# Patient Record
Sex: Male | Born: 2000 | Race: Black or African American | Marital: Single | Smoking: Never smoker
Health system: Southern US, Community
[De-identification: ages and names within clinical notes are randomized; demographics above are authoritative.]

---

## 2019-12-08 ENCOUNTER — Ambulatory Visit: Payer: Self-pay | Attending: Family

## 2019-12-08 DIAGNOSIS — Z23 Encounter for immunization: Secondary | ICD-10-CM

## 2019-12-08 NOTE — Progress Notes (Signed)
   Covid-19 Vaccination Clinic  Name:  Melvin Mcdonald    MRN: [id number] DOB: November 06, 2000  12/08/2019  [name] was observed post Covid-19 immunization for 15 minutes without incident. He was provided with Vaccine Information Sheet and instruction to access the V-Safe system.   [name] was instructed to call 911 with any severe reactions post vaccine: Marland Kitchen Difficulty breathing  . Swelling of face and throat  . A fast heartbeat  . A bad rash all over body  . Dizziness and weakness   Immunizations Administered    Name Date Dose VIS Date Route   Moderna COVID-19 Vaccine 12/08/2019 11:02 AM 0.5 mL 08/02/2019 Intramuscular   Manufacturer: Moderna   Lot: [id number]   NDC: 55208-022-33

## 2020-01-10 ENCOUNTER — Ambulatory Visit: Payer: Self-pay | Attending: Family

## 2020-01-10 DIAGNOSIS — Z23 Encounter for immunization: Secondary | ICD-10-CM

## 2020-01-10 NOTE — Progress Notes (Signed)
   Covid-19 Vaccination Clinic  Name:  Melvin Mcdonald    MRN: [id number] DOB: 14-Mar-2001  01/10/2020  [name] was observed post Covid-19 immunization for 15 minutes without incident. He was provided with Vaccine Information Sheet and instruction to access the V-Safe system.   [name] was instructed to call 911 with any severe reactions post vaccine: Marland Kitchen Difficulty breathing  . Swelling of face and throat  . A fast heartbeat  . A bad rash all over body  . Dizziness and weakness   Immunizations Administered    Name Date Dose VIS Date Route   Moderna COVID-19 Vaccine 01/10/2020 10:23 AM 0.5 mL 08/2019 Intramuscular   Manufacturer: Moderna   Lot: [id number]   NDC: 70623-762-83

## 2020-06-03 ENCOUNTER — Other Ambulatory Visit: Payer: Self-pay

## 2020-06-03 ENCOUNTER — Encounter: Payer: Self-pay | Admitting: Family Medicine

## 2020-06-03 ENCOUNTER — Ambulatory Visit
Admission: EM | Admit: 2020-06-03 | Discharge: 2020-06-03 | Disposition: A | Discharge status: Home / Self Care | Payer: BC Managed Care – PPO | Attending: Family Medicine | Admitting: Family Medicine

## 2020-06-03 ENCOUNTER — Ambulatory Visit (INDEPENDENT_AMBULATORY_CARE_PROVIDER_SITE_OTHER): Payer: BC Managed Care – PPO

## 2020-06-03 DIAGNOSIS — M25572 Pain in left ankle and joints of left foot: Secondary | ICD-10-CM

## 2020-06-03 DIAGNOSIS — S92155A Nondisplaced avulsion fracture (chip fracture) of left talus, initial encounter for closed fracture: Secondary | ICD-10-CM

## 2020-06-03 NOTE — ED Triage Notes (Signed)
Pt states he fell on Monday and injured his left ankle. Pt states he has been unable to walk on it until yesterday. Pain has generally improved but he still has pain when turning different directions. Pt is aox4 and ambulatory.

## 2020-06-03 NOTE — ED Provider Notes (Signed)
[facility]    CSN: [id number] Arrival date & time: 06/03/20  1528      History   Chief Complaint Chief Complaint  Patient presents with  . Ankle Pain    since fall on Monday    HPI Melvin Mcdonald is a 19 y.o. male.   Initial [facility] patient visit.  19 yo male presents with left ankle pain following twisting type trauma.Pt states he fell on Monday and injured his left ankle. Pt states he has been unable to walk on it until yesterday. Pain has generally improved but he still has pain when turning different directions. Pt is aox4 and ambulatory     History reviewed. No pertinent past medical history.  There are no problems to display for this patient.   History reviewed. No pertinent surgical history.     Home Medications    Prior to Admission medications   Not on File    Family History History reviewed. No pertinent family history.  Social History Social History   Tobacco Use  . Smoking status: Never Smoker  . Smokeless tobacco: Never Used  Vaping Use  . Vaping Use: Never used  Substance Use Topics  . Alcohol use: Not Currently  . Drug use: Never     Allergies   Patient has no known allergies.   Review of Systems Review of Systems  Musculoskeletal: Positive for gait problem.     Physical Exam Triage Vital Signs ED Triage Vitals  Enc Vitals Group     BP      Pulse      Resp      Temp      Temp src      SpO2      Weight      Height      Head Circumference      Peak Flow      Pain Score      Pain Loc      Pain Edu?      Excl. in GC?    No data found.  Updated Vital Signs BP 126/67 (BP Location: Left Arm)   Pulse 74   Temp 98 F (36.7 C) (Oral)   Resp 12   SpO2 97%    Physical Exam Vitals and nursing note reviewed.  Constitutional:      Appearance: Normal appearance.  Eyes:     Conjunctiva/sclera: Conjunctivae normal.  Pulmonary:     Breath sounds: Normal breath sounds.  Musculoskeletal:        General:  Swelling, tenderness and signs of injury present. No deformity. Normal range of motion.     Cervical back: Normal range of motion and neck supple.     Comments: Tender over left lateral talus  Skin:    General: Skin is warm and dry.     Findings: No bruising.  Neurological:     General: No focal deficit present.     Mental Status: He is alert.  Psychiatric:        Mood and Affect: Mood normal.      UC Treatments / Results  Labs (all labs ordered are listed, but only abnormal results are displayed) Labs Reviewed - No data to display  EKG   Radiology No results found.  Procedures Procedures (including critical care time)  Medications Ordered in UC Medications - No data to display  Initial Impression / Assessment and Plan / UC Course  I have reviewed the triage vital signs and the nursing notes.  Pertinent labs & imaging results that were available during my care of the patient were reviewed by me and considered in my medical decision making (see chart for details).    Final Clinical Impressions(s) / UC Diagnoses   Final diagnoses:  Closed nondisplaced avulsion fracture of left talus, initial encounter     Discharge Instructions     You have a small fracture of the talus bone.  It should heal in 4 weeks.  We recommend you follow up with the orthopedist     ED Prescriptions    None     I have reviewed the PDMP during this encounter.   Elvina Sidle, MD 06/03/20 [phone]

## 2020-06-03 NOTE — Discharge Instructions (Addendum)
You have a small fracture of the talus bone.  It should heal in 4 weeks.  We recommend you follow up with the orthopedist

## 2020-06-15 ENCOUNTER — Other Ambulatory Visit: Payer: Self-pay

## 2020-06-15 ENCOUNTER — Ambulatory Visit
Admission: RE | Admit: 2020-06-15 | Discharge: 2020-06-15 | Disposition: A | Discharge status: Home / Self Care | Payer: BC Managed Care – PPO | Source: Ambulatory Visit

## 2020-06-15 VITALS — BP 125/74 | HR 74 | Temp 98.5°F | Resp 18

## 2020-06-15 DIAGNOSIS — M67432 Ganglion, left wrist: Secondary | ICD-10-CM

## 2020-06-15 NOTE — ED Triage Notes (Signed)
Pt c/o knot to lt wrist when bending after working out with 2lb hand weights. States pain when twisting wrist.

## 2020-06-15 NOTE — Discharge Instructions (Signed)
Ibuprofen 600-800mg  three times a day for 5-10 days. Can add ice compress if having swelling. Follow up with hand orthopedics if symptoms worsens, does not resolve or reoccurs frequently.

## 2020-06-15 NOTE — ED Provider Notes (Signed)
[facility]    CSN: [id number] Arrival date & time: 06/15/20  1649      History   Chief Complaint Chief Complaint  Patient presents with  . appt 5-wrist pain    HPI Melvin Mcdonald is a 19 y.o. male.   19 year old male comes in for left wrist pain, swelling for the past few days. States noticed after doing 2lb wrist exercise. Pain is worse with ROM without radiation of pain. Denies numbness/tingling, loss of grip strength. LHD     History reviewed. No pertinent past medical history.  There are no problems to display for this patient.   History reviewed. No pertinent surgical history.     Home Medications    Prior to Admission medications   Not on File    Family History No family history on file.  Social History Social History   Tobacco Use  . Smoking status: Never Smoker  . Smokeless tobacco: Never Used  Vaping Use  . Vaping Use: Never used  Substance Use Topics  . Alcohol use: Not Currently  . Drug use: Never     Allergies   Patient has no known allergies.   Review of Systems Review of Systems  Reason unable to perform ROS: See HPI as above.     Physical Exam Triage Vital Signs ED Triage Vitals  Enc Vitals Group     BP 06/15/20 1746 125/74     Pulse Rate 06/15/20 1746 74     Resp 06/15/20 1746 18     Temp 06/15/20 1746 98.5 F (36.9 C)     Temp Source 06/15/20 1746 Oral     SpO2 06/15/20 1746 98 %     Weight --      Height --      Head Circumference --      Peak Flow --      Pain Score 06/15/20 1747 3     Pain Loc --      Pain Edu? --      Excl. in GC? --    No data found.  Updated Vital Signs BP 125/74 (BP Location: Left Arm)   Pulse 74   Temp 98.5 F (36.9 C) (Oral)   Resp 18   SpO2 98%   Physical Exam Constitutional:      General: He is not in acute distress.    Appearance: Normal appearance. He is well-developed. He is not toxic-appearing or diaphoretic.  HENT:     Head: Normocephalic and atraumatic.   Eyes:     Conjunctiva/sclera: Conjunctivae normal.     Pupils: Pupils are equal, round, and reactive to light.  Pulmonary:     Effort: Pulmonary effort is normal. No respiratory distress.  Musculoskeletal:     Cervical back: Normal range of motion and neck supple.     Comments: 0.5cm round mobile lesion to the dorsum of left wrist. No erythema, warmth. No tenderness to palpation. Full ROM of wrist. Strength 5/5. Sensation intact. Radial pulse 2+  Skin:    General: Skin is warm and dry.  Neurological:     Mental Status: He is alert and oriented to person, place, and time.      UC Treatments / Results  Labs (all labs ordered are listed, but only abnormal results are displayed) Labs Reviewed - No data to display  EKG   Radiology No results found.  Procedures Procedures (including critical care time)  Medications Ordered in UC Medications - No data to display  Initial Impression / Assessment and Plan / UC Course  I have reviewed the triage vital signs and the nursing notes.  Pertinent labs & imaging results that were available during my care of the patient were reviewed by me and considered in my medical decision making (see chart for details).    History and exam consistent with ganglion cyst. NSAIDs, ice compress, rest. Return precautions given.  Final Clinical Impressions(s) / UC Diagnoses   Final diagnoses:  Ganglion cyst of dorsum of left wrist    ED Prescriptions    None     PDMP not reviewed this encounter.   Belinda Fisher, PA-C 06/15/20 [phone]

## 2020-07-11 ENCOUNTER — Ambulatory Visit: Payer: BC Managed Care – PPO | Admitting: Medical

## 2020-07-11 ENCOUNTER — Encounter: Payer: Self-pay | Admitting: Medical

## 2020-07-11 ENCOUNTER — Other Ambulatory Visit: Payer: Self-pay

## 2020-07-11 VITALS — BP 124/84 | HR 78 | Ht 66.0 in | Wt 198.4 lb

## 2020-07-11 DIAGNOSIS — G479 Sleep disorder, unspecified: Secondary | ICD-10-CM | POA: Diagnosis not present

## 2020-07-11 DIAGNOSIS — N528 Other male erectile dysfunction: Secondary | ICD-10-CM | POA: Diagnosis not present

## 2020-07-11 NOTE — Patient Instructions (Signed)
RESOURCES in [place], [place]  If you are experiencing a mental health crisis or an emergency, please call 911 or go to the nearest emergency department.  [facility]   [phone] [facility]  [phone] [facility]   [phone]  Suicide Hotline 1-800-Suicide 910 587 4423)  National Suicide Prevention Lifeline 531 079 2274  920 570 7594)  Domestic Violence, Rape/Crisis - [facility] [place] 742-595-6387  [employer] Violence Hotline 1-800-799-SAFE (380)572-6356)  To report Child or Elder Abuse, please call: [facility] Police Department  [phone] [facility] Department  [phone]  Teen Crisis line 360-047-8653 or 434-358-0554     Counseling Services (NON- psychiatrist offices)  Dr. Nicole Cella, [place] [phone] [address] [place], [place] [address]   [facility] [address] [place], [place] [address] [phone]   Glade Lloyd, therapist [phone] [address] [place], [place], [place] [address]   [facility] [phone] [address] [place], [place], [place] [address]     Erectile Dysfunction Erectile dysfunction (ED) is the inability to get or keep an erection in order to have sexual intercourse. Erectile dysfunction may include:  Inability to get an erection.  Lack of enough hardness of the erection to allow penetration.  Loss of the erection before sex is finished. What are the causes? This condition may be caused by:  Certain medicines, such as: ? Pain relievers. ? Antihistamines. ? Antidepressants. ? Blood pressure medicines. ? Water pills (diuretics). ? Ulcer medicines. ? Muscle relaxants. ? Drugs.  Excessive drinking.  Psychological causes, such as: ? Anxiety. ? Depression. ? Sadness. ? Exhaustion. ? Performance fear. ? Stress.  Physical causes, such as: ? Artery problems. This may include diabetes, smoking,  liver disease, or atherosclerosis. ? High blood pressure. ? Hormonal problems, such as low testosterone. ? Obesity. ? Nerve problems. This may include back or pelvic injuries, diabetes mellitus, multiple sclerosis, or Parkinson disease. What are the signs or symptoms? Symptoms of this condition include:  Inability to get an erection.  Lack of enough hardness of the erection to allow penetration.  Loss of the erection before sex is finished.  Normal erections at some times, but with frequent unsatisfactory episodes.  Low sexual satisfaction in either partner due to erection problems.  A curved penis occurring with erection. The curve may cause pain or the penis may be too curved to allow for intercourse.  Never having nighttime erections. How is this diagnosed? This condition is often diagnosed by:  Performing a physical exam to find other diseases or specific problems with the penis.  Asking you detailed questions about the problem.  Performing blood tests to check for diabetes mellitus or to measure hormone levels.  Performing other tests to check for underlying health conditions.  Performing an ultrasound exam to check for scarring.  Performing a test to check blood flow to the penis.  Doing a sleep study at home to measure nighttime erections. How is this treated? This condition may be treated by:  Medicine taken by mouth to help you achieve an erection (oral medicine).  Hormone replacement therapy to replace low testosterone levels.  Medicine that is injected into the penis. Your health care provider may instruct you how to give yourself these injections at home.  Vacuum pump. This is a pump with a ring on it. The pump and ring are placed on the penis and used to create pressure that helps the penis become erect.  Penile implant surgery. In this procedure, you may receive: ?  An inflatable implant. This consists of cylinders, a pump, and a reservoir. The cylinders  can be inflated with a fluid that helps to create an erection, and they can be deflated after intercourse. ? A semi-rigid implant. This consists of two silicone rubber rods. The rods provide some rigidity. They are also flexible, so the penis can both curve downward in its normal position and become straight for sexual intercourse.  Blood vessel surgery, to improve blood flow to the penis. During this procedure, a blood vessel from a different part of the body is placed into the penis to allow blood to flow around (bypass) damaged or blocked blood vessels.  Lifestyle changes, such as exercising more, losing weight, and quitting smoking. Follow these instructions at home: Medicines   Take over-the-counter and prescription medicines only as told by your health care provider. Do not increase the dosage without first discussing it with your health care provider.  If you are using self-injections, perform injections as directed by your health care provider. Make sure to avoid any veins that are on the surface of the penis. After giving an injection, apply pressure to the injection site for 5 minutes. General instructions  Exercise regularly, as directed by your health care provider. Work with your health care provider to lose weight, if needed.  Do not use any products that contain nicotine or tobacco, such as cigarettes and e-cigarettes. If you need help quitting, ask your health care provider.  Before using a vacuum pump, read the instructions that come with the pump and discuss any questions with your health care provider.  Keep all follow-up visits as told by your health care provider. This is important. Contact a health care provider if:  You feel nauseous.  You vomit. Get help right away if:  You are taking oral or injectable medicines and you have an erection that lasts longer than 4 hours. If your health care provider is unavailable, go to the nearest emergency room for evaluation. An  erection that lasts much longer than 4 hours can result in permanent damage to your penis.  You have severe pain in your groin or abdomen.  You develop redness or severe swelling of your penis.  You have redness spreading up into your groin or lower abdomen.  You are unable to urinate.  You experience chest pain or a rapid heart beat (palpitations) after taking oral medicines. Summary  Erectile dysfunction (ED) is the inability to get or keep an erection during sexual intercourse. This problem can usually be treated successfully.  This condition is diagnosed based on a physical exam, your symptoms, and tests to determine the cause. Treatment varies depending on the cause, and may include medicines, hormone therapy, surgery, or vacuum pump.  You may need follow-up visits to make sure that you are using your medicines or devices correctly.  Get help right away if you are taking or injecting medicines and you have an erection that lasts longer than 4 hours. This information is not intended to replace advice given to you by your health care provider. Make sure you discuss any questions you have with your health care provider. Document Revised: 07/31/2017 Document Reviewed: 09/03/2016 Elsevier Patient Education  2020 [employer].

## 2020-07-11 NOTE — Progress Notes (Signed)
Subjective:  Melvin Mcdonald is a 19 y.o. male who presents for Chief Complaint  Patient presents with  . New Patient (Initial Visit)    establish care   . Sexual Problem    problems getting/stayig aroused      Here as a new patient today.  He reports problems getting and keeping erections.  Is usually erect enough for sex but not usual full erection.  This happens often but not all the time.  He does get morning erections.  Maybe not as frequently as an in the past, but he does get them regularly.  Sometimes he gets a full erection with masturbation sometimes not.  He sometimes masturbate more than once a day.  There have been a few times where he had some pain with masturbation or ejaculation but he attributes that to multiple masturbation episodes in a short period of time.  He denies any blood in the urine or semen.  No testicular pain or mass.  He does note a gradual upward curve of the penis but no significant curvature or pain with erections.  He notes 2 prior relationships.  His current relationship x2 months with a male.  He in the same person dated last year but she broke up not wanting a committed relationship for period of time.  His prior relationship ended after that person wanted to be sexually active with other partners.  He notes prior STD testing, no prior STD  He denies any major significant conversations with his parents or others about sex growing up  He has tried a tea with horny goat weed.    Denies mood problems, no depression,denies anxiety.  He works part-time at [employer] in [place].  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  He exercises some.  Does not always sleep the best, gets 5 to 6 hours of sleep most nights.  No other aggravating or relieving factors.    No other c/o.  The following portions of the patient's history were reviewed and updated as appropriate: allergies, current medications, past family  history, past medical history, past social history, past surgical history and problem list.  ROS Otherwise as in subjective above  Objective: BP 124/84   Pulse 78   Ht 5\' 6"  (1.676 m)   Wt 198 lb 6.4 oz (90 kg)   SpO2 96%   BMI 32.02 kg/m   General appearance: alert, no distress, well developed, well nourished Neck: supple, no lymphadenopathy, no thyromegaly, no masses Heart: RRR, normal S1, S2, no murmurs Lungs: CTA bilaterally, no wheezes, rhonchi, or rales Pulses: 2+ radial pulses, 2+ pedal pulses, normal cap refill Ext: no edema GU: normal male, circ, no testicular mass or lesions, no hernia, no lymphadenopathy, no obvious scar tissue or deformity    Assessment: Encounter Diagnoses  Name Primary?  . Other male erectile dysfunction Yes  . Sleep disturbance      Plan: We discussed his symptoms and concerns.  I do not suspect a physical problem or cause of his symptoms.  I think this is more psychosocial.  We discussed how many things can play a role in whether he gets a full erection or not.  I strongly recommended counseling.  Gave him a list of counselors to consider given this chapter in stage in his life and given the new relationship.  We discussed limiting masturbation to once daily, and discussed the role that overstimulation or porn or other stimuli can play in  altering arousal particularly with relationships and our own emotions.  Reiterated to him that he does not have a physical deformity or obvious medical problem, reassured.  Encourage counseling, discussed some literature to read to help.  Advise if any pain with intercourse, if any significant curvature of the penis, or if not seeing some improvements over the next several weeks we can consider other steps.  Jakeem was seen today for new patient (initial visit) and sexual problem.  Diagnoses and all orders for this visit:  Other male erectile dysfunction  Sleep disturbance    Follow up: soon for  physical

## 2021-03-12 ENCOUNTER — Other Ambulatory Visit: Payer: Self-pay

## 2021-03-12 ENCOUNTER — Ambulatory Visit (INDEPENDENT_AMBULATORY_CARE_PROVIDER_SITE_OTHER): Payer: BC Managed Care – PPO

## 2021-03-12 ENCOUNTER — Ambulatory Visit
Admission: EM | Admit: 2021-03-12 | Discharge: 2021-03-12 | Disposition: A | Discharge status: Home / Self Care | Payer: BC Managed Care – PPO | Attending: Student | Admitting: Student

## 2021-03-12 DIAGNOSIS — S92354A Nondisplaced fracture of fifth metatarsal bone, right foot, initial encounter for closed fracture: Secondary | ICD-10-CM

## 2021-03-12 DIAGNOSIS — M79671 Pain in right foot: Secondary | ICD-10-CM | POA: Diagnosis not present

## 2021-03-12 NOTE — ED Triage Notes (Signed)
While running yesterday, Pt make a quick turn a "rolled" his right foot causing an onset of swelling and pain in the dorsum surface of his right foot. Notes difficulty bending 4th and fifth toes. Has been taking ibuprofen with some relief. Denies ankle pain. No LLE sxs.

## 2021-03-12 NOTE — ED Provider Notes (Signed)
[facility]    CSN: [id number] Arrival date & time: 03/12/21  2595      History   Chief Complaint Chief Complaint  Patient presents with   Foot Pain    right    HPI Melvin Mcdonald is a 20 y.o. male presenting with R foot pain following eversion injury that occurred 1 day ago while running. He does have a history of R ankle sprain about 1 year ago, same ankle.  Denies any ankle pain today, but does endorse pain of the lateral right foot, worse with ambulating.  Denies sensation changes.  Denies falls, pain or injury elsewhere.    HPI  History reviewed. No pertinent past medical history.  Patient Active Problem List   Diagnosis Date Noted   Other male erectile dysfunction 07/11/2020   Sleep disturbance 07/11/2020    History reviewed. No pertinent surgical history.     Home Medications    Prior to Admission medications   Not on File    Family History History reviewed. No pertinent family history.  Social History Social History   Tobacco Use   Smoking status: Never   Smokeless tobacco: Never  Vaping Use   Vaping Use: Never used  Substance Use Topics   Alcohol use: Not Currently   Drug use: Never     Allergies   Patient has no known allergies.   Review of Systems Review of Systems  Musculoskeletal:         R foot pain  All other systems reviewed and are negative.   Physical Exam Triage Vital Signs ED Triage Vitals  Enc Vitals Group     BP 03/12/21 1023 112/70     Pulse Rate 03/12/21 1023 67     Resp 03/12/21 1023 18     Temp 03/12/21 1023 97.9 F (36.6 C)     Temp Source 03/12/21 1023 Oral     SpO2 03/12/21 1023 97 %     Weight --      Height --      Head Circumference --      Peak Flow --      Pain Score 03/12/21 1029 8     Pain Loc --      Pain Edu? --      Excl. in GC? --    No data found.  Updated Vital Signs BP 112/70 (BP Location: Left Arm)   Pulse 67   Temp 97.9 F (36.6 C) (Oral)   Resp 18   SpO2 97%    Visual Acuity Right Eye Distance:   Left Eye Distance:   Bilateral Distance:    Right Eye Near:   Left Eye Near:    Bilateral Near:     Physical Exam Vitals reviewed.  Constitutional:      General: He is not in acute distress.    Appearance: Normal appearance. He is not ill-appearing or diaphoretic.  HENT:     Head: Normocephalic and atraumatic.  Cardiovascular:     Rate and Rhythm: Normal rate and regular rhythm.     Heart sounds: Normal heart sounds.  Pulmonary:     Effort: Pulmonary effort is normal.     Breath sounds: Normal breath sounds.  Musculoskeletal:     Comments: R lateral foot with tenderness and effusion over lateral 5th metatarsal. No ecchymosis or abrasion. Sensation intact. ROM 5th toe limited. Cap refill <2 seconds, DP 2+. No  medial or lateral malleolar tenderness, no tenderness along ATF ligament. Plantar and  dorsiflexion intact and without pain.  Skin:    General: Skin is warm.  Neurological:     General: No focal deficit present.     Mental Status: He is alert and oriented to person, place, and time.  Psychiatric:        Mood and Affect: Mood normal.        Behavior: Behavior normal.        Thought Content: Thought content normal.        Judgment: Judgment normal.     UC Treatments / Results  Labs (all labs ordered are listed, but only abnormal results are displayed) Labs Reviewed - No data to display  EKG   Radiology DG Foot Complete Right  Result Date: 03/12/2021 CLINICAL DATA:  Rolled foot playing laser tag 1 day ago, RIGHT foot pain and swelling, increased pain with weight-bearing EXAM: RIGHT FOOT COMPLETE - 3+ VIEW COMPARISON:  None FINDINGS: Osseous mineralization normal. Joint spaces preserved. Nondisplaced fracture at base of fifth metatarsal. No additional fracture, dislocation, or bone destruction. IMPRESSION: Nondisplaced fracture at base of RIGHT fifth metatarsal. Electronically Signed   By: Ulyses Southward M.D.   On: 03/12/2021 11:09     Procedures Procedures (including critical care time)  Medications Ordered in UC Medications - No data to display  Initial Impression / Assessment and Plan / UC Course  I have reviewed the triage vital signs and the nursing notes.  Pertinent labs & imaging results that were available during my care of the patient were reviewed by me and considered in my medical decision making (see chart for details).     This patient is a very pleasant 20 y.o. year old male presenting with R 5th metatarsal fracture. Neurovascularly intact.   Xray R foot- Nondisplaced fracture at base of RIGHT fifth metatarsal.  Given small nondisplaced fracture with some swelling, will place in CAM boot with crutches and nonweightbearing, f/u with ortho at their earliest convenience.  He already has a cam boot at home from spraining his ankle 1 year ago, so he declines  new cam boot today.  RICE, Tylenol/ibuprofen.  ED return precautions discussed. Patient verbalizes understanding and agreement.    Final Clinical Impressions(s) / UC Diagnoses   Final diagnoses:  Closed nondisplaced fracture of fifth metatarsal bone of right foot, initial encounter     Discharge Instructions      -You have a fracture at the side of your right foot.  -Xray R foot- Nondisplaced fracture at base of RIGHT fifth metatarsal. -Call [facility] today and schedule a follow-up appointment with them, information below. -When you walk, use your walking boot and crutches to avoid putting much weight on your foot.  You can take the boot off to shower, but make sure to not put weight on your foot without the boot. -For pain, ice, elevation, Take Tylenol 1000 mg 3 times daily, and ibuprofen 800 mg 3 times daily with food.  You can take these together, or alternate every 3-4 hours.      ED Prescriptions   None    PDMP not reviewed this encounter.   Rhys Martini, PA-C 03/12/21 1256

## 2021-03-12 NOTE — Discharge Instructions (Addendum)
-  You have a fracture at the side of your right foot.  -Xray R foot- Nondisplaced fracture at base of RIGHT fifth metatarsal. -Call [facility] today and schedule a follow-up appointment with them, information below. -When you walk, use your walking boot and crutches to avoid putting much weight on your foot.  You can take the boot off to shower, but make sure to not put weight on your foot without the boot. -For pain, ice, elevation, Take Tylenol 1000 mg 3 times daily, and ibuprofen 800 mg 3 times daily with food.  You can take these together, or alternate every 3-4 hours.

## 2021-09-12 ENCOUNTER — Ambulatory Visit
Admission: EM | Admit: 2021-09-12 | Discharge: 2021-09-12 | Disposition: A | Discharge status: Home / Self Care | Payer: BC Managed Care – PPO

## 2021-09-12 ENCOUNTER — Encounter: Payer: Self-pay | Admitting: Physician Assistant

## 2021-09-12 ENCOUNTER — Other Ambulatory Visit: Payer: Self-pay

## 2021-09-12 DIAGNOSIS — J069 Acute upper respiratory infection, unspecified: Secondary | ICD-10-CM

## 2021-09-12 NOTE — ED Provider Notes (Signed)
[facility]    CSN: [id number] Arrival date & time: 09/12/21  1627      History   Chief Complaint Chief Complaint  Patient presents with   Sore Throat    HPI Melvin Mcdonald is a 21 y.o. male.   Here today for evaluation of chest congestion, sore throat, nasal drainage for 4 to 6 days.  He has not had fever.  He reports that symptoms seem to be improving and then he developed some chest congestion which concerned him and brought him in for evaluation today.  He has tried Mucinex without significant relief.  The history is provided by the patient.   History reviewed. No pertinent past medical history.  Patient Active Problem List   Diagnosis Date Noted   Other male erectile dysfunction 07/11/2020   Sleep disturbance 07/11/2020    History reviewed. No pertinent surgical history.     Home Medications    Prior to Admission medications   Not on File    Family History History reviewed. No pertinent family history.  Social History Social History   Tobacco Use   Smoking status: Never   Smokeless tobacco: Never  Vaping Use   Vaping Use: Never used  Substance Use Topics   Alcohol use: Not Currently   Drug use: Never     Allergies   Patient has no known allergies.   Review of Systems Review of Systems  Constitutional:  Negative for chills and fever.  HENT:  Positive for congestion and sore throat. Negative for ear pain.   Eyes:  Negative for discharge and redness.  Respiratory:  Positive for cough. Negative for shortness of breath.   Gastrointestinal:  Negative for abdominal pain, diarrhea, nausea and vomiting.    Physical Exam Triage Vital Signs ED Triage Vitals  Enc Vitals Group     BP      Pulse      Resp      Temp      Temp src      SpO2      Weight      Height      Head Circumference      Peak Flow      Pain Score      Pain Loc      Pain Edu?      Excl. in GC?    No data found.  Updated Vital Signs BP 129/69 (BP  Location: Left Arm)    Pulse 69    Temp 98.5 F (36.9 C) (Oral)    Ht 5\' 6"  (1.676 m)    Wt 180 lb (81.6 kg)    SpO2 96%    BMI 29.05 kg/m      Physical Exam Vitals and nursing note reviewed.  Constitutional:      General: He is not in acute distress.    Appearance: Normal appearance. He is not ill-appearing.  HENT:     Head: Normocephalic and atraumatic.     Nose: Nose normal. No congestion.     Mouth/Throat:     Mouth: Mucous membranes are moist.     Pharynx: Oropharynx is clear. No oropharyngeal exudate or posterior oropharyngeal erythema.  Eyes:     Conjunctiva/sclera: Conjunctivae normal.  Cardiovascular:     Rate and Rhythm: Normal rate and regular rhythm.     Heart sounds: Normal heart sounds. No murmur heard. Pulmonary:     Effort: Pulmonary effort is normal. No respiratory distress.     Breath sounds: Normal  breath sounds. No wheezing, rhonchi or rales.  Skin:    General: Skin is warm and dry.  Neurological:     Mental Status: He is alert.  Psychiatric:        Mood and Affect: Mood normal.        Thought Content: Thought content normal.     UC Treatments / Results  Labs (all labs ordered are listed, but only abnormal results are displayed) Labs Reviewed - No data to display  EKG   Radiology No results found.  Procedures Procedures (including critical care time)  Medications Ordered in UC Medications - No data to display  Initial Impression / Assessment and Plan / UC Course  I have reviewed the triage vital signs and the nursing notes.  Pertinent labs & imaging results that were available during my care of the patient were reviewed by me and considered in my medical decision making (see chart for details).    Suspect likely viral etiology of symptoms and recommended continued symptomatic treatment, and increase fluids.  Encouraged follow-up with any further concerns or symptoms persist.  Final Clinical Impressions(s) / UC Diagnoses   Final  diagnoses:  Acute upper respiratory infection   Discharge Instructions   None    ED Prescriptions   None    PDMP not reviewed this encounter.   Tomi Bamberger, PA-C 09/12/21 1700

## 2021-09-12 NOTE — ED Triage Notes (Signed)
Patient c/o chest congestion, some sore throat, cough, nasal drainage x 4 days.  Home COVID test was negative.  Denies any OTC meds.

## 2022-03-12 ENCOUNTER — Ambulatory Visit
Admission: EM | Admit: 2022-03-12 | Discharge: 2022-03-12 | Disposition: A | Discharge status: Home / Self Care | Payer: BC Managed Care – PPO | Attending: Internal Medicine | Admitting: Internal Medicine

## 2022-03-12 ENCOUNTER — Ambulatory Visit (INDEPENDENT_AMBULATORY_CARE_PROVIDER_SITE_OTHER): Payer: BC Managed Care – PPO

## 2022-03-12 DIAGNOSIS — M79674 Pain in right toe(s): Secondary | ICD-10-CM | POA: Diagnosis not present

## 2022-03-12 MED ORDER — IBUPROFEN 800 MG PO TABS
800.0000 mg | ORAL_TABLET | Freq: Three times a day (TID) | ORAL | 0 refills | Status: DC
Start: 2022-03-12 — End: 2023-08-20

## 2022-03-12 NOTE — ED Triage Notes (Signed)
Pt report went on skate boarding and stumped the foot on too hard last month. Pain continued from then Sharp pain especially when moving in different direction or putting a lot of weight of the foot  Onset : last month  Pain level 5

## 2022-03-12 NOTE — Discharge Instructions (Addendum)
Trial of Motrin for your pain and buddy splint for comfort.  The x-ray today showed no evidence of fracture.  Follow-up with the podiatrist if not improving or worse at any time for other recommendations.

## 2022-03-12 NOTE — ED Provider Notes (Signed)
[facility]   MRN: [id number] DOB: June 22, 2001  Subjective:   Chief Complaint;  Chief Complaint  Patient presents with   Toe Pain   Pt report went on skate boarding and stumped the foot on too hard last month. Pain continued from then Sharp pain especially when moving in different direction or putting a lot of weight of the foot   Onset : last month   Pain level 5 Melvin Mcdonald is a 21 y.o. male presenting for right second toe pain for 1 month after accidentally stubbing his toe while riding his skateboard.  Pain has been mild to moderate and diffuse to the region of the second toe.  He denies ever having bruising or obvious swelling however pain continues.  No current facility-administered medications for this encounter.  Current Outpatient Medications:    ibuprofen (ADVIL) 800 MG tablet, Take 1 tablet (800 mg total) by mouth 3 (three) times daily., Disp: 21 tablet, Rfl: 0   No Known Allergies  No past medical history on file.   Review of Systems  All other systems reviewed and are negative.    Objective:   Vitals: BP 137/74 (BP Location: Right Arm)   Temp 98.1 F (36.7 C)   Resp 19   Ht 5\' 6"  (1.676 m)   SpO2 96%   BMI 29.05 kg/m   Physical Exam Vitals and nursing note reviewed.  Constitutional:      General: He is not in acute distress.    Appearance: He is well-developed.  HENT:     Head: Normocephalic and atraumatic.  Eyes:     Conjunctiva/sclera: Conjunctivae normal.  Cardiovascular:     Rate and Rhythm: Normal rate and regular rhythm.     Heart sounds: No murmur heard. Pulmonary:     Effort: Pulmonary effort is normal. No respiratory distress.     Breath sounds: Normal breath sounds.  Abdominal:     Palpations: Abdomen is soft.     Tenderness: There is no abdominal tenderness.  Musculoskeletal:        General: No swelling.     Cervical back: Neck supple.     Comments: Tenderness to palpation mild mid second toe on the right  no deformity, swelling bruising or neurovascular deficit noted distally.  Nail is intact  Skin:    General: Skin is warm and dry.     Capillary Refill: Capillary refill takes less than 2 seconds.  Neurological:     Mental Status: He is alert.  Psychiatric:        Mood and Affect: Mood normal.     No results found for this or any previous visit (from the past 24 hour(s)).  DG Toe 2nd Right  Result Date: 03/12/2022 CLINICAL DATA:  Right second toe injury EXAM: RIGHT SECOND TOE COMPARISON:  None Available. FINDINGS: There is no evidence of fracture or dislocation. There is no evidence of arthropathy or other focal bone abnormality. Soft tissues are unremarkable. IMPRESSION: Negative. Electronically Signed   By: 05/13/2022 M.D.   On: 03/12/2022 11:31     RIGHT SECOND TOE   COMPARISON:  None Available.   FINDINGS: There is no evidence of fracture or dislocation. There is no evidence of arthropathy or other focal bone abnormality. Soft tissues are unremarkable.  I agree with the wet read from radiology  IMPRESSION: Negative.  Assessment and Plan :   1. Toe pain, right     Meds ordered this encounter  Medications  ibuprofen (ADVIL) 800 MG tablet    Sig: Take 1 tablet (800 mg total) by mouth 3 (three) times daily.    Dispense:  21 tablet    Refill:  0    Order Specific Question:   Supervising Provider    Answer:   Merrilee Jansky [[id number]]    MDM:  Melvin Mcdonald is a 21 y.o. male presenting for right second toe pain after injury 1 month ago.  Foot is negative for any signs of trauma on clinical exam.  Patient is encouraged to take Motrin and buddy splint until pain is resolved.  He will follow-up with podiatry if not improving or worse at any time for reevaluation.  I discussed todays findings, treatment plan, follow up and return instructions. Questions were answered. Patient/representative stated understanding of the instructions and patient is stable for  discharge.  Melvin Conger FNP-C MSN    Jone Baseman, NP 03/12/22 1141

## 2022-11-03 ENCOUNTER — Ambulatory Visit
Admission: EM | Admit: 2022-11-03 | Discharge: 2022-11-03 | Disposition: A | Discharge status: Home / Self Care | Payer: BC Managed Care – PPO | Attending: Physician Assistant | Admitting: Physician Assistant

## 2022-11-03 DIAGNOSIS — R509 Fever, unspecified: Secondary | ICD-10-CM | POA: Insufficient documentation

## 2022-11-03 DIAGNOSIS — J029 Acute pharyngitis, unspecified: Secondary | ICD-10-CM | POA: Insufficient documentation

## 2022-11-03 LAB — POCT RAPID STREP A (OFFICE): Rapid Strep A Screen: NEGATIVE

## 2022-11-03 NOTE — ED Triage Notes (Signed)
Pt presents with c/o sore throat, HA, and fatigue that began yesterday

## 2022-11-03 NOTE — Discharge Instructions (Signed)
Advised to give Tylenol or Motrin as needed for fever along with lozenges and gargles to help soothe throat.  Advised to follow-up PCP or return to urgent care if symptoms fail to improve.

## 2022-11-03 NOTE — ED Provider Notes (Signed)
[facility]    CSN: [id number] Arrival date & time: 11/03/22  0817      History   Chief Complaint Chief Complaint  Patient presents with   Sore Throat    HPI Melvin Mcdonald is a 22 y.o. male.   22 year old male presents with fever and sore throat.  Patient indicates over the past 48 hours he has been having increasing sore throat and painful swallowing.  He indicates he has had mild fatigue and lethargy.  Patient relates having fever 100 over the past 24 hours.  Indicates he has not had any cough or congestion.  He does indicate having mild postnasal drip mainly clear production.  He relates he has not been exposed to any friends, family, or coworkers with similar type symptoms.  He is tolerating fluids well.   Sore Throat    History reviewed. No pertinent past medical history.  Patient Active Problem List   Diagnosis Date Noted   Other male erectile dysfunction 07/11/2020   Sleep disturbance 07/11/2020    History reviewed. No pertinent surgical history.     Home Medications    Prior to Admission medications   Medication Sig Start Date End Date Taking? Authorizing Provider  ibuprofen (ADVIL) 800 MG tablet Take 1 tablet (800 mg total) by mouth 3 (three) times daily. 03/12/22   Hezzie Bump, NP    Family History History reviewed. No pertinent family history.  Social History Social History   Tobacco Use   Smoking status: Never   Smokeless tobacco: Never  Vaping Use   Vaping Use: Never used  Substance Use Topics   Alcohol use: Yes    Alcohol/week: 1.0 - 3.0 standard drink of alcohol    Types: 1 - 3 Glasses of wine per week   Drug use: Never     Allergies   Patient has no known allergies.   Review of Systems Review of Systems  Constitutional:  Positive for fatigue and fever.  HENT:  Positive for sore throat.      Physical Exam Triage Vital Signs ED Triage Vitals [11/03/22 0839]  Enc Vitals Group     BP 107/69     Pulse Rate (!)  107     Resp 14     Temp 100 F (37.8 C)     Temp Source Oral     SpO2 95 %     Weight      Height      Head Circumference      Peak Flow      Pain Score 5     Pain Loc      Pain Edu?      Excl. in [place]?    No data found.  Updated Vital Signs BP 107/69 (BP Location: Right Arm)   Pulse (!) 107   Temp 100 F (37.8 C) (Oral)   Resp 14   SpO2 95%   Visual Acuity Right Eye Distance:   Left Eye Distance:   Bilateral Distance:    Right Eye Near:   Left Eye Near:    Bilateral Near:     Physical Exam Constitutional:      Appearance: He is well-developed.  HENT:     Right Ear: Ear canal normal. Tympanic membrane is injected.     Left Ear: Ear canal normal. Tympanic membrane is injected.     Mouth/Throat:     Mouth: Mucous membranes are moist.     Pharynx: Posterior oropharyngeal erythema present. No oropharyngeal  exudate.  Cardiovascular:     Rate and Rhythm: Normal rate and regular rhythm.     Heart sounds: Normal heart sounds.  Pulmonary:     Effort: Pulmonary effort is normal.     Breath sounds: Normal breath sounds and air entry. No wheezing, rhonchi or rales.  Lymphadenopathy:     Cervical: Cervical adenopathy (mild anterior cervical adenopathy present bilat) present.  Neurological:     Mental Status: He is alert.      UC Treatments / Results  Labs (all labs ordered are listed, but only abnormal results are displayed) Labs Reviewed  CULTURE, GROUP A STREP [facility])  POCT RAPID STREP A (OFFICE)    EKG   Radiology No results found.  Procedures Procedures (including critical care time)  Medications Ordered in UC Medications - No data to display  Initial Impression / Assessment and Plan / UC Course  I have reviewed the triage vital signs and the nursing notes.  Pertinent labs & imaging results that were available during my care of the patient were reviewed by me and considered in my medical decision making (see chart for details).    Plan: The  diagnosis will be treated with the following: Sore throat: A.  Throat culture is pending. B.  Advised to use Motrin or ibuprofen along with lozenges and gargles to help soothe the sore throat. 2.  Fever: A.  Advised to use Tylenol or Motrin to control fever and body aches. 3.  Advised follow-up PCP or return to urgent care as needed. Final Clinical Impressions(s) / UC Diagnoses   Final diagnoses:  Fever, unspecified  Sore throat     Discharge Instructions      Advised to give Tylenol or Motrin as needed for fever along with lozenges and gargles to help soothe throat.  Advised to follow-up PCP or return to urgent care if symptoms fail to improve.    ED Prescriptions   None    PDMP not reviewed this encounter.   Nyoka Lint, PA-C 11/03/22 [phone]

## 2022-11-06 LAB — CULTURE, GROUP A STREP (THRC)

## 2022-11-09 ENCOUNTER — Encounter: Payer: Self-pay | Admitting: Emergency Medicine

## 2022-11-09 ENCOUNTER — Ambulatory Visit
Admission: EM | Admit: 2022-11-09 | Discharge: 2022-11-09 | Disposition: A | Discharge status: Home / Self Care | Payer: BC Managed Care – PPO | Attending: Internal Medicine | Admitting: Internal Medicine

## 2022-11-09 DIAGNOSIS — R590 Localized enlarged lymph nodes: Secondary | ICD-10-CM | POA: Diagnosis not present

## 2022-11-09 DIAGNOSIS — K121 Other forms of stomatitis: Secondary | ICD-10-CM | POA: Diagnosis not present

## 2022-11-09 LAB — POCT MONO SCREEN (KUC): Mono, POC: NEGATIVE

## 2022-11-09 MED ORDER — TRIAMCINOLONE ACETONIDE 0.1 % MT PSTE: 1.0000 | PASTE | Freq: Two times a day (BID) | OROMUCOSAL | 12 refills | Status: DC

## 2022-11-09 MED ORDER — AMOXICILLIN-POT CLAVULANATE 875-125 MG PO TABS: 1.0000 | ORAL_TABLET | Freq: Two times a day (BID) | ORAL | 0 refills | Status: DC

## 2022-11-09 NOTE — ED Triage Notes (Signed)
Patient c/o swollen lymph node on the right side of neck x 2 days, marks in mouth.  Pain while chewing and eating.  Patient has taken Tylenol and Mucous relief.

## 2022-11-09 NOTE — ED Provider Notes (Signed)
[facility]    CSN: [id number] Arrival date & time: 11/09/22  0836      History   Chief Complaint Chief Complaint  Patient presents with   Swollen Lymph Node    HPI Melvin Mcdonald is a 22 y.o. male.   Patient presents with mouth lesions and lymph node swelling on the right side of the neck that has been present for about 2 days.  Patient was seen on 3/4 with sore throat.  He reports sore throat has now resolved.  He tested negative for strep throat at that visit.  Denies any associated upper respiratory symptoms, cough, fever.  Denies any known sick contacts.  Patient has taken Tylenol for symptoms.     History reviewed. No pertinent past medical history.  Patient Active Problem List   Diagnosis Date Noted   Other male erectile dysfunction 07/11/2020   Sleep disturbance 07/11/2020    History reviewed. No pertinent surgical history.     Home Medications    Prior to Admission medications   Medication Sig Start Date End Date Taking? Authorizing Provider  amoxicillin-clavulanate (AUGMENTIN) 875-125 MG tablet Take 1 tablet by mouth every 12 (twelve) hours. 11/09/22  Yes Catheryne Deford, Hildred Alamin E, FNP  ibuprofen (ADVIL) 800 MG tablet Take 1 tablet (800 mg total) by mouth 3 (three) times daily. 03/12/22  Yes Hezzie Bump, NP  triamcinolone (KENALOG) 0.1 % paste Use as directed 1 Application in the mouth or throat 2 (two) times daily. 11/09/22  Yes Misheel Gowans, Michele Rockers, FNP    Family History Family History  Problem Relation Age of Onset   Healthy Mother    Healthy Father     Social History Social History   Tobacco Use   Smoking status: Never   Smokeless tobacco: Never  Vaping Use   Vaping Use: Never used  Substance Use Topics   Alcohol use: Yes    Alcohol/week: 1.0 - 3.0 standard drink of alcohol    Types: 1 - 3 Glasses of wine per week   Drug use: Never     Allergies   Patient has no known allergies.   Review of Systems Review of Systems Per  HPI  Physical Exam Triage Vital Signs ED Triage Vitals  Enc Vitals Group     BP 11/09/22 0851 135/79     Pulse Rate 11/09/22 0851 83     Resp 11/09/22 0851 18     Temp 11/09/22 0851 98.1 F (36.7 C)     Temp Source 11/09/22 0851 Oral     SpO2 11/09/22 0851 98 %     Weight 11/09/22 0853 170 lb (77.1 kg)     Height 11/09/22 0853 '5\' 6"'$  (1.676 m)     Head Circumference --      Peak Flow --      Pain Score 11/09/22 0853 4     Pain Loc --      Pain Edu? --      Excl. in [place]? --    No data found.  Updated Vital Signs BP 135/79 (BP Location: Left Arm)   Pulse 83   Temp 98.1 F (36.7 C) (Oral)   Resp 18   Ht '5\' 6"'$  (1.676 m)   Wt 170 lb (77.1 kg)   SpO2 98%   BMI 27.44 kg/m   Visual Acuity Right Eye Distance:   Left Eye Distance:   Bilateral Distance:    Right Eye Near:   Left Eye Near:    Bilateral Near:  Physical Exam Constitutional:      General: He is not in acute distress.    Appearance: Normal appearance. He is not toxic-appearing or diaphoretic.  HENT:     Head: Normocephalic and atraumatic.     Mouth/Throat:      Comments: Patient has ulcer present to right posterior inner cheek.  There is some mild erythema and mild swelling surrounding ulcer to inner cheek.  Patient also has very small ulcer present to the underside of the lateral right tongue.  No drainage from either one of these. Eyes:     Extraocular Movements: Extraocular movements intact.     Conjunctiva/sclera: Conjunctivae normal.  Pulmonary:     Effort: Pulmonary effort is normal.  Lymphadenopathy:     Cervical: Cervical adenopathy present.     Right cervical: Deep cervical adenopathy present.     Upper Body:     Right upper body: No supraclavicular adenopathy.     Left upper body: No supraclavicular adenopathy.     Comments: Right lower deep cervical lymph node swelling with no discoloration or tenderness to palpation.  Neurological:     General: No focal deficit present.     Mental  Status: He is alert and oriented to person, place, and time. Mental status is at baseline.  Psychiatric:        Mood and Affect: Mood normal.        Behavior: Behavior normal.        Thought Content: Thought content normal.        Judgment: Judgment normal.      UC Treatments / Results  Labs (all labs ordered are listed, but only abnormal results are displayed) Labs Reviewed  CBC  COMPREHENSIVE METABOLIC PANEL  POCT MONO SCREEN ([place])    EKG   Radiology No results found.  Procedures Procedures (including critical care time)  Medications Ordered in UC Medications - No data to display  Initial Impression / Assessment and Plan / UC Course  I have reviewed the triage vital signs and the nursing notes.  Pertinent labs & imaging results that were available during my care of the patient were reviewed by me and considered in my medical decision making (see chart for details).     1.  Mouth ulcers Ulcers appear consistent with aphthous ulcers on the tongue and inner cheek.  I am mildly concerned about infection surrounding the one to the right inner cheek so will treat with Augmentin.  Also prescribed Orabase to apply to ulcers to decrease inflammation.  Advised patient to follow-up with dentist for further evaluation and management.  2.  Lymph node swelling It appears that patient has deep cervical lymph node swelling on the right side.  No obvious supraclavicular lymph node swelling.  Although, will obtain CMP and CBC to rule out any worrisome etiologies.  Most likely viral in nature though.  Rapid mono was negative.  Appointment made for patient on 3/14 with PCP for further evaluation and management of this.  Discussed the importance of attending this PCP appointment.  No signs of airway compromise so do not think an emergent evaluation is necessary.  Patient verbalized understanding and was agreeable with plan. Final Clinical Impressions(s) / UC Diagnoses   Final diagnoses:   Mouth ulcers  Cervical lymphadenopathy     Discharge Instructions      Rapid mono was negative.  It appears that you have ulcers in your mouth so I have prescribed a paste to apply directly to the area  alleviate discomfort.  I have also prescribed antibiotic to treat dental or gum infection.  Follow-up with dentist tomorrow to schedule appointment for further evaluation.  Blood work is pending for lymph node swelling.  Will call if there are any abnormalities.  Please follow-up with family doctor for further evaluation and management.    ED Prescriptions     Medication Sig Dispense Auth. Provider   amoxicillin-clavulanate (AUGMENTIN) 875-125 MG tablet Take 1 tablet by mouth every 12 (twelve) hours. 14 tablet [place], [place] E, [place]   triamcinolone (KENALOG) 0.1 % paste Use as directed 1 Application in the mouth or throat 2 (two) times daily. 5 g Teodora Medici, [place]      PDMP not reviewed this encounter.   Teodora Medici,  11/09/22 [phone]

## 2022-11-09 NOTE — Discharge Instructions (Signed)
Rapid mono was negative.  It appears that you have ulcers in your mouth so I have prescribed a paste to apply directly to the area alleviate discomfort.  I have also prescribed antibiotic to treat dental or gum infection.  Follow-up with dentist tomorrow to schedule appointment for further evaluation.  Blood work is pending for lymph node swelling.  Will call if there are any abnormalities.  Please follow-up with family doctor for further evaluation and management.

## 2022-11-10 LAB — CBC
Hematocrit: 40.1 % (ref 37.5–51.0)
Hemoglobin: 13.2 g/dL (ref 13.0–17.7)
MCH: 26.7 pg (ref 26.6–33.0)
MCHC: 32.9 g/dL (ref 31.5–35.7)
MCV: 81 fL (ref 79–97)
Platelets: 174 10*3/uL (ref 150–450)
RBC: 4.95 x10E6/uL (ref 4.14–5.80)
RDW: 12.2 % (ref 11.6–15.4)
WBC: 2.7 10*3/uL — ABNORMAL LOW (ref 3.4–10.8)

## 2022-11-10 LAB — COMPREHENSIVE METABOLIC PANEL
ALT: 12 IU/L (ref 0–44)
AST: 17 IU/L (ref 0–40)
Albumin/Globulin Ratio: 1.7 (ref 1.2–2.2)
Albumin: 4.5 g/dL (ref 4.3–5.2)
Alkaline Phosphatase: 57 IU/L (ref 44–121)
BUN/Creatinine Ratio: 11 (ref 9–20)
BUN: 10 mg/dL (ref 6–20)
Bilirubin Total: <0.2 mg/dL (ref 0.0–1.2)
CO2: 24 mmol/L (ref 20–29)
Calcium: 8.9 mg/dL (ref 8.7–10.2)
Chloride: 102 mmol/L (ref 96–106)
Creatinine, Ser: 0.88 mg/dL (ref 0.76–1.27)
Globulin, Total: 2.7 g/dL (ref 1.5–4.5)
Glucose: 73 mg/dL (ref 70–99)
Potassium: 4.4 mmol/L (ref 3.5–5.2)
Sodium: 139 mmol/L (ref 134–144)
Total Protein: 7.2 g/dL (ref 6.0–8.5)
eGFR: 123 mL/min/{1.73_m2} (ref >59)

## 2022-11-13 ENCOUNTER — Encounter: Payer: Self-pay | Admitting: Family Medicine

## 2022-11-13 ENCOUNTER — Ambulatory Visit: Payer: BC Managed Care – PPO | Admitting: Family Medicine

## 2022-11-13 VITALS — BP 113/66 | HR 63 | Temp 98.0°F | Resp 16 | Ht 66.0 in | Wt 171.6 lb

## 2022-11-13 DIAGNOSIS — K1370 Unspecified lesions of oral mucosa: Secondary | ICD-10-CM

## 2022-11-13 DIAGNOSIS — R591 Generalized enlarged lymph nodes: Secondary | ICD-10-CM

## 2022-11-13 DIAGNOSIS — D72819 Decreased white blood cell count, unspecified: Secondary | ICD-10-CM | POA: Diagnosis not present

## 2022-11-13 DIAGNOSIS — Z7689 Persons encountering health services in other specified circumstances: Secondary | ICD-10-CM | POA: Diagnosis not present

## 2022-11-13 DIAGNOSIS — Z1159 Encounter for screening for other viral diseases: Secondary | ICD-10-CM

## 2022-11-13 NOTE — Progress Notes (Signed)
New Patient Office Visit  Subjective    Patient ID: Melvin Mcdonald, male    DOB: 2000/12/22  Age: 22 y.o. MRN: [id number]  CC:  Chief Complaint  Patient presents with   Establish Care    Patient is here to establish care with new provider patient states that he was referred here by [facility] on 11/09/22 for mouth ulcers    HPI Melvin Mcdonald presents to establish care. Pt is new to me.  Pt was referred here by urgent care. He went to urgent care on 3/4 due to sore throat, body aches, chills, and headache. He was swabbed for strep and was negative. He was told to take otc tylenol prn for pain. He then went back on 3/10 due to a swollen lump on the right side of his neck. He denied sore throat but had sore ulcers on his tongue. He was told to go to a dentist and he has appt scheduled for next week. He was given 2 medicine at that time. He is still taking Augmentin bid and kenalog paste. He says the lump is still on his neck and still somewhat painful. He says the lesions look like they are growing.  He is currently sexually active. He does use protection but does report oral sex.    Outpatient Encounter Medications as of 11/13/2022  Medication Sig   amoxicillin-clavulanate (AUGMENTIN) 875-125 MG tablet Take 1 tablet by mouth every 12 (twelve) hours.   ibuprofen (ADVIL) 800 MG tablet Take 1 tablet (800 mg total) by mouth 3 (three) times daily.   triamcinolone (KENALOG) 0.1 % paste Use as directed 1 Application in the mouth or throat 2 (two) times daily.   No facility-administered encounter medications on file as of 11/13/2022.    History reviewed. No pertinent past medical history.  History reviewed. No pertinent surgical history.  Family History  Problem Relation Age of Onset   Healthy Mother    Healthy Father     Social History   Socioeconomic History   Marital status: Single    Spouse name: Not on file   Number of children: Not on file   Years of education: Not on file    Highest education level: Not on file  Occupational History   Not on file  Tobacco Use   Smoking status: Never   Smokeless tobacco: Never  Vaping Use   Vaping Use: Never used  Substance and Sexual Activity   Alcohol use: Yes    Alcohol/week: 1.0 - 3.0 standard drink of alcohol    Types: 1 - 3 Glasses of wine per week   Drug use: Never   Sexual activity: Not Currently  Other Topics Concern   Not on file  Social History Narrative   Not on file   Social Determinants of Health   Financial Resource Strain: Not on file  Food Insecurity: Not on file  Transportation Needs: Not on file  Physical Activity: Not on file  Stress: Not on file  Social Connections: Not on file  Intimate Partner Violence: Not on file    Review of Systems  HENT:         Mouth lesions and enlarged lymph node on the right side of neck  Genitourinary:  Negative for dysuria, flank pain, frequency, hematuria and urgency.  All other systems reviewed and are negative.       Objective    BP 113/66   Pulse 63   Temp 98 F (36.7 C) (Oral)  Resp 16   Ht '5\' 6"'$  (1.676 m)   Wt 171 lb 9.6 oz (77.8 kg)   SpO2 97%   BMI 27.70 kg/m   Physical Exam Vitals and nursing note reviewed.  Constitutional:      Appearance: Normal appearance. He is normal weight.  HENT:     Head: Normocephalic and atraumatic.     Right Ear: External ear normal.     Left Ear: External ear normal.     Nose: Nose normal.     Mouth/Throat:     Mouth: Mucous membranes are moist.     Comments: White papular lesions on tongue Eyes:     Pupils: Pupils are equal, round, and reactive to light.  Neck:   Cardiovascular:     Rate and Rhythm: Normal rate and regular rhythm.     Pulses: Normal pulses.     Heart sounds: Normal heart sounds.  Pulmonary:     Effort: Pulmonary effort is normal.     Breath sounds: Normal breath sounds.  Abdominal:     General: Abdomen is flat.  Lymphadenopathy:     Cervical: Cervical adenopathy present.      Right cervical: Superficial cervical adenopathy present.  Skin:    Capillary Refill: Capillary refill takes less than 2 seconds.  Neurological:     General: No focal deficit present.     Mental Status: He is alert and oriented to person, place, and time. Mental status is at baseline.  Psychiatric:        Mood and Affect: Mood normal.        Behavior: Behavior normal.        Thought Content: Thought content normal.        Judgment: Judgment normal.       Assessment & Plan:   Problem List Items Addressed This Visit   None  Encounter to establish care with new doctor  Mouth lesion  Leukopenia, unspecified type -     CBC with Differential/Platelet -     US SOFT TISSUE HEAD & NECK (NON-THYROID); Future -     US SOFT TISSUE HEAD & NECK (NON-THYROID); Future  Need for hepatitis C screening test -     Hepatitis C antibody  Screening for viral disease -     HIV Antibody (routine testing w rflx) -     RPR -     Virus culture  Lymphadenopathy -     US SOFT TISSUE HEAD & NECK (NON-THYROID); Future -     US SOFT TISSUE HEAD & NECK (NON-THYROID); Future  Discussed with pt today that I'm uncertain his diagnosis and cause of his mouth lesions. He had recurrent head/neck infection with sore throat and fever. Mono and strep tests were negative. Had leukopenia in the urgent care. No previous lab results to compare with.  He is sexually active and does have oral sex. He reports last encounter was month ago. Will do viral oral swab today to rule out any STI's. Also screening HIV and hep c with syphlis screen for complete evaluation Rechecking CBC due to leukopenia although this could be from his infection.  He does have lymphadenopathy on one side. Will send for ultrasound to make sure this is an enlarged lymph node from recent infection with hx of leukopenia. No follow-ups on file.   Leeanne [place], MD

## 2022-11-14 LAB — CBC WITH DIFFERENTIAL/PLATELET
Basophils Absolute: 0 10*3/uL (ref 0.0–0.2)
Basos: 1 %
EOS (ABSOLUTE): 0 10*3/uL (ref 0.0–0.4)
Eos: 1 %
Hematocrit: 39 % (ref 37.5–51.0)
Hemoglobin: 13.2 g/dL (ref 13.0–17.7)
Immature Grans (Abs): 0 10*3/uL (ref 0.0–0.1)
Immature Granulocytes: 0 %
Lymphocytes Absolute: 1.2 10*3/uL (ref 0.7–3.1)
Lymphs: 38 %
MCH: 27.4 pg (ref 26.6–33.0)
MCHC: 33.8 g/dL (ref 31.5–35.7)
MCV: 81 fL (ref 79–97)
Monocytes Absolute: 0.3 10*3/uL (ref 0.1–0.9)
Monocytes: 8 %
Neutrophils Absolute: 1.7 10*3/uL (ref 1.4–7.0)
Neutrophils: 52 %
Platelets: 260 10*3/uL (ref 150–450)
RBC: 4.81 x10E6/uL (ref 4.14–5.80)
RDW: 13 % (ref 11.6–15.4)
WBC: 3.3 10*3/uL — ABNORMAL LOW (ref 3.4–10.8)

## 2022-11-14 LAB — HEPATITIS C ANTIBODY: Hep C Virus Ab: NONREACTIVE

## 2022-11-14 LAB — HIV ANTIBODY (ROUTINE TESTING W REFLEX): HIV Screen 4th Generation wRfx: NONREACTIVE

## 2022-11-14 LAB — RPR: RPR Ser Ql: NONREACTIVE

## 2022-11-19 IMAGING — DX DG FOOT COMPLETE 3+V*R*
3 series · 3 of 3 positions shown · non-contrast
Comparison: None

CLINICAL DATA: Rolled foot playing laser tag 1 day ago, RIGHT foot
pain and swelling, increased pain with weight-bearing

EXAM:
RIGHT FOOT COMPLETE - 3+ VIEW

[foot supine dp]
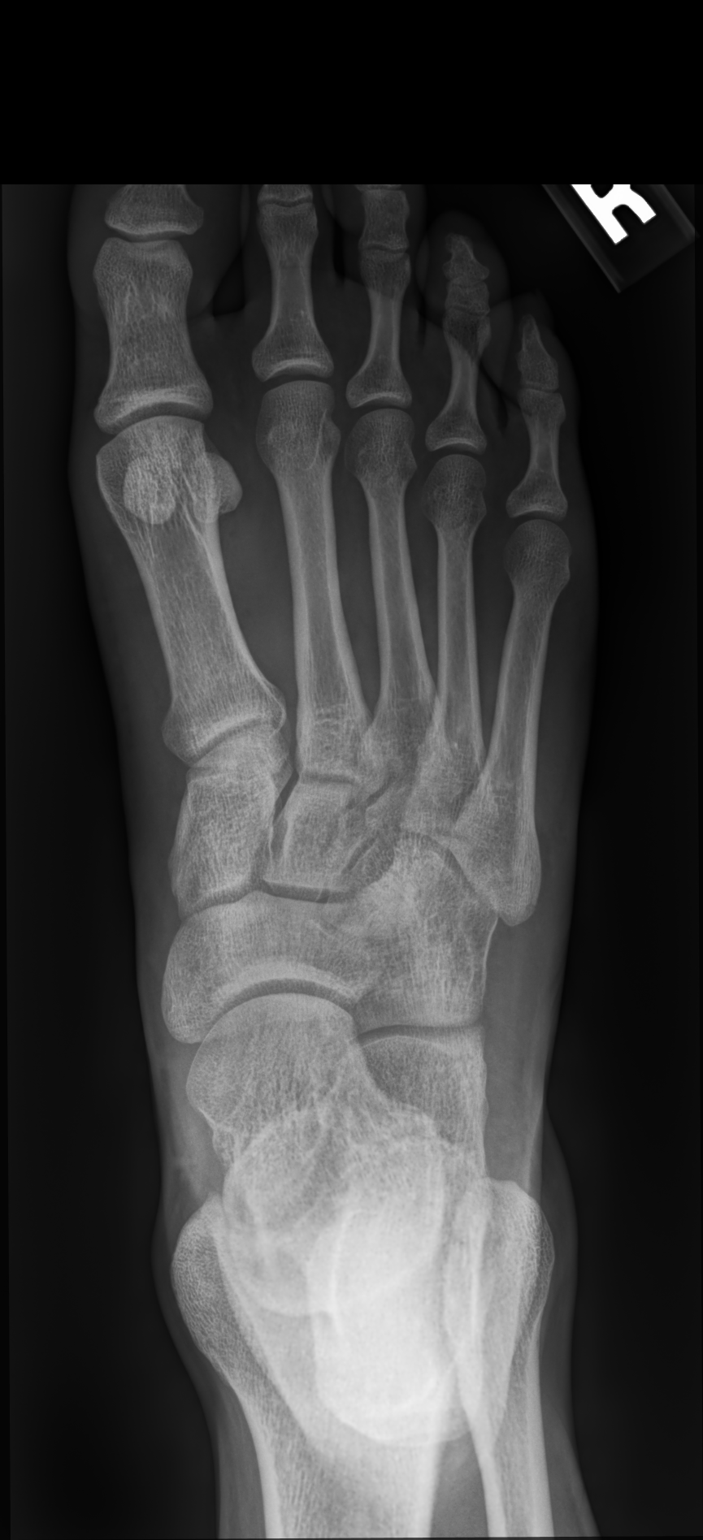

[foot medial oblique]
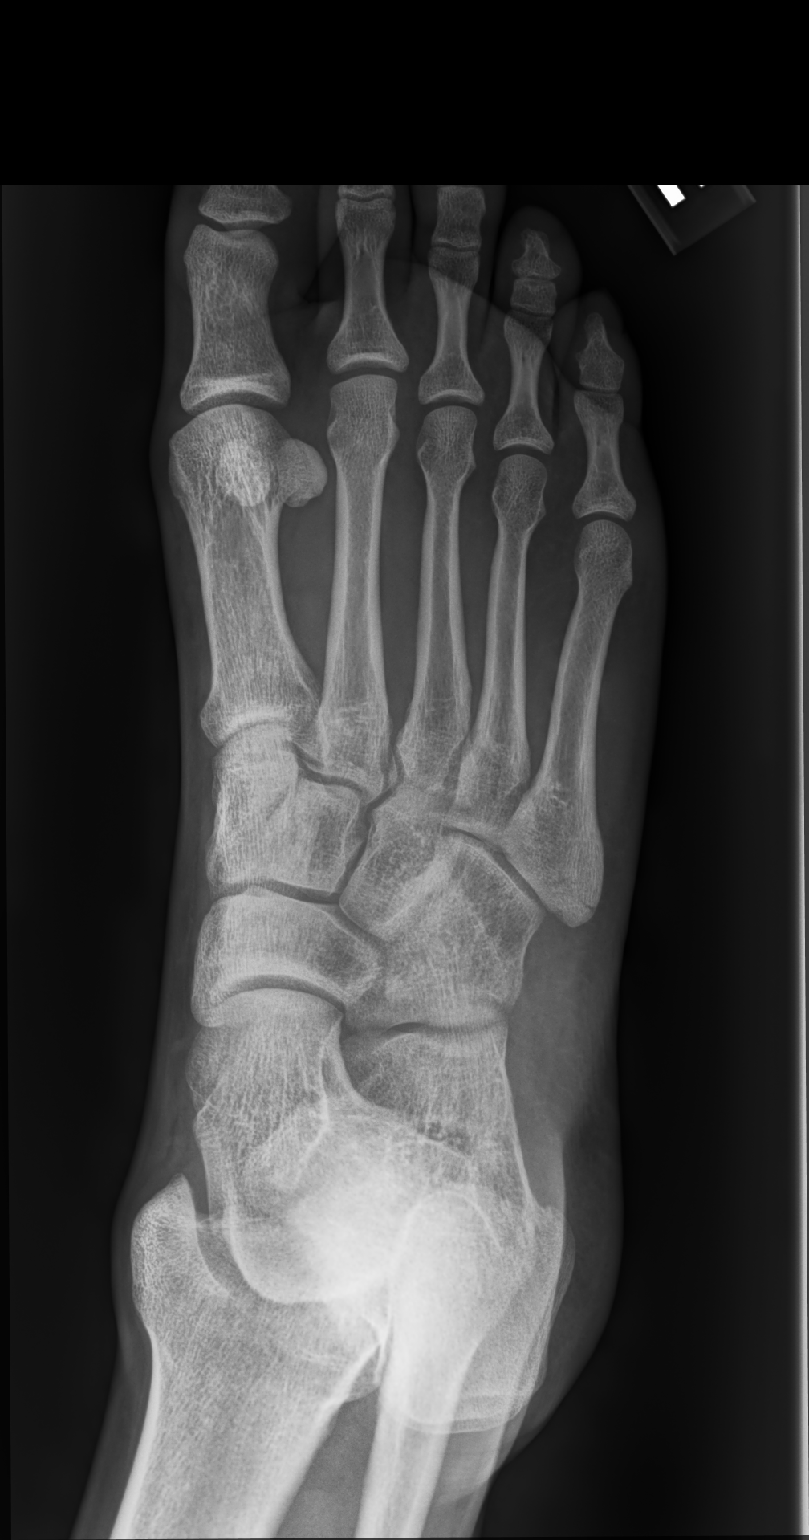

[foot supine lat]
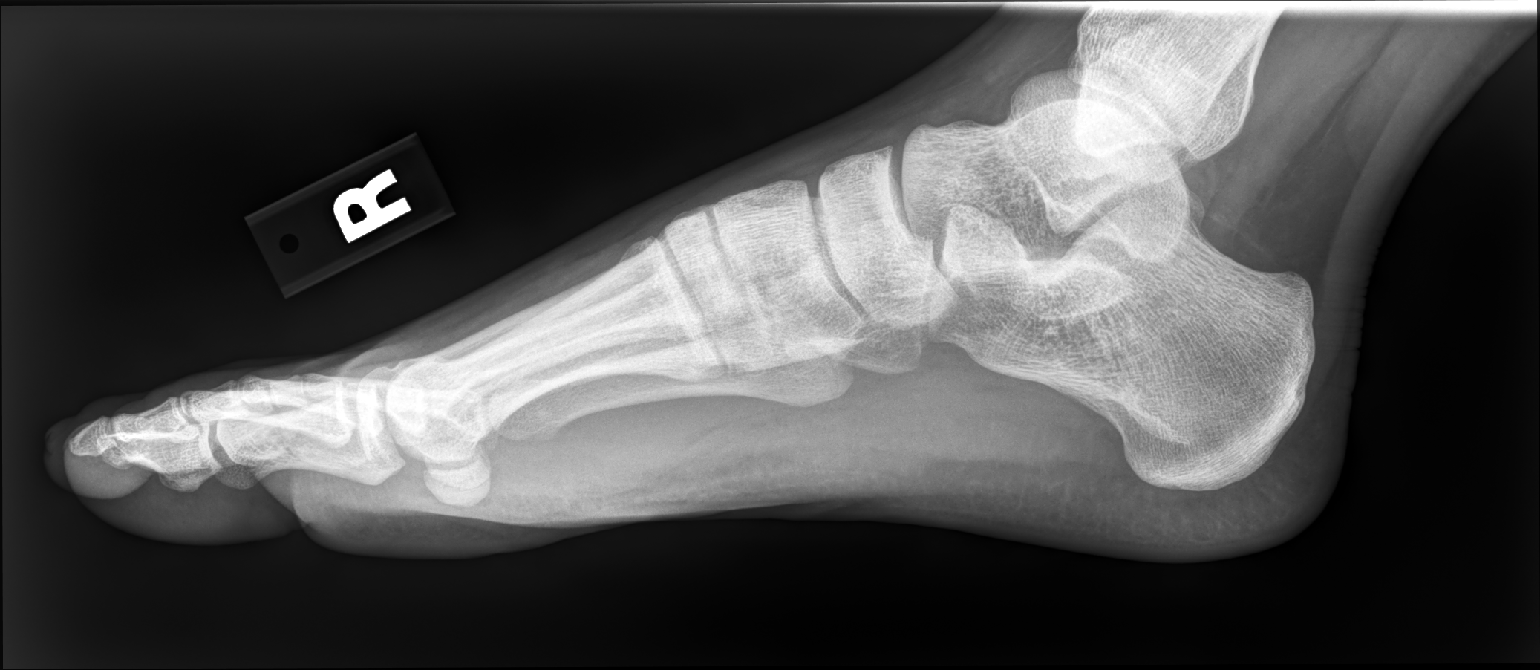

[3 of 3 positions shown; findings below may reference images not displayed]

FINDINGS: Osseous mineralization normal.

Joint spaces preserved.

Nondisplaced fracture at base of fifth metatarsal.

No additional fracture, dislocation, or bone destruction.
IMPRESSION: Nondisplaced fracture at base of RIGHT fifth metatarsal.

## 2022-11-20 ENCOUNTER — Encounter: Payer: Self-pay | Admitting: Family Medicine

## 2022-11-20 ENCOUNTER — Telehealth: Payer: Self-pay | Admitting: Family Medicine

## 2022-11-20 ENCOUNTER — Telehealth (INDEPENDENT_AMBULATORY_CARE_PROVIDER_SITE_OTHER): Payer: BC Managed Care – PPO | Admitting: Family Medicine

## 2022-11-20 DIAGNOSIS — B009 Herpesviral infection, unspecified: Secondary | ICD-10-CM

## 2022-11-20 LAB — VIRUS CULTURE

## 2022-11-20 MED ORDER — VALACYCLOVIR HCL 500 MG PO TABS: 500.0000 mg | ORAL_TABLET | Freq: Every day | ORAL | 1 refills | Status: DC

## 2022-11-20 NOTE — Telephone Encounter (Signed)
Patient returned call regarding results and requested call back at number on file. Melvin Mcdonald

## 2022-11-20 NOTE — Progress Notes (Signed)
   Established Patient Virtual Visit  Subjective   Patient ID: Melvin Mcdonald, male    DOB: Feb 09, 2001  Age: 22 y.o. MRN: [id number]  Chief Complaint  Patient presents with   Lab results review    HPI Virtual Visit via Video Note  I connected with Melvin Mcdonald on 11/20/22 at  2:50 PM EDT by a video enabled telemedicine application and verified that I am speaking with the correct person using two identifiers.  Location: Patient: home in [place] Provider: Primary care welden Loni Dolly   I discussed the limitations of evaluation and management by telemedicine and the availability of in person appointments. The patient expressed understanding and agreed to proceed.  History of Present Illness:    pt recently seen for oral lesions. 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 Pt asks about this diagnoses. Questions was explained today and pt would like to go on suppressive treatment. He also would like to see if he's had this infection long. Explained to him that unable to do this without lab work. He is wanting to have labs done.  Pt reports his previous oral lesions are gone. He has seen dentist also and has lesions on his cheek from his wisdom teeth. He will be following back up with dentist.     I discussed the assessment and treatment plan with the patient. The patient was provided an opportunity to ask questions and all were answered. The patient agreed with the plan and demonstrated an understanding of the instructions.   The patient was advised to call back or seek an in-person evaluation if the symptoms worsen or if the condition fails to improve as anticipated.  I provided 22 minutes of non-face-to-face time during this encounter.  Review of Systems  All other systems reviewed and are negative.    Physical Exam Constitutional:      Appearance: Normal appearance. He is normal weight.  Pulmonary:     Effort: Pulmonary effort is normal.   Skin:    Capillary Refill: Capillary refill takes less than 2 seconds.  Neurological:     General: No focal deficit present.     Mental Status: He is alert and oriented to person, place, and time. Mental status is at baseline.  Psychiatric:        Mood and Affect: Mood normal.        Behavior: Behavior normal.        Thought Content: Thought content normal.        Judgment: Judgment normal.    HSV-1 (herpes simplex virus 1) infection -     valACYclovir HCl; Take 1 tablet (500 mg total) by mouth daily.  Dispense: 90 tablet; Refill: 1 -     HSV 1 and 2 Ab, IgG   Viral culture of oral lesions taken in OV dated 3/14 confirms HSV-1. Will also do labs antibodies and send in Valtrex for suppressive treatment  Total time spent with patient today 22 minutes. This includes reviewing records, evaluating the patient and coordinating care. Face-to-face time >50%.  Leeanne [place], MD

## 2022-11-20 NOTE — Telephone Encounter (Signed)
Attempted to contact pt  but unable to reach him. I did leave a VM asking for a return call. If he calls back, please relay viral culture results that was positive for herpes which is the cause of his mouth sores. I would like to know if his symptoms and lesions resolved? If not, I will send him an antiviral medication. He can also set up a virtual mychart video visit with me if he wants to discuss this further.

## 2022-11-21 ENCOUNTER — Ambulatory Visit (INDEPENDENT_AMBULATORY_CARE_PROVIDER_SITE_OTHER): Payer: BC Managed Care – PPO

## 2022-11-21 ENCOUNTER — Other Ambulatory Visit: Payer: BC Managed Care – PPO

## 2022-11-21 DIAGNOSIS — D72819 Decreased white blood cell count, unspecified: Secondary | ICD-10-CM | POA: Diagnosis not present

## 2022-11-21 DIAGNOSIS — R591 Generalized enlarged lymph nodes: Secondary | ICD-10-CM | POA: Diagnosis not present

## 2022-11-22 LAB — HSV 1 AND 2 AB, IGG
HSV 1 Glycoprotein G Ab, IgG: 5.53 index — ABNORMAL HIGH (ref 0.00–0.90)
HSV 2 IgG, Type Spec: <0.91 index (ref 0.00–0.90)

## 2023-04-22 ENCOUNTER — Encounter: Payer: Self-pay | Admitting: Family Medicine

## 2023-04-22 ENCOUNTER — Ambulatory Visit: Payer: BC Managed Care – PPO | Admitting: Family Medicine

## 2023-04-22 VITALS — BP 107/66 | HR 64 | Temp 97.7°F | Resp 18 | Ht 66.0 in | Wt 180.3 lb

## 2023-04-22 DIAGNOSIS — K640 First degree hemorrhoids: Secondary | ICD-10-CM | POA: Diagnosis not present

## 2023-04-22 DIAGNOSIS — B009 Herpesviral infection, unspecified: Secondary | ICD-10-CM

## 2023-04-22 DIAGNOSIS — R599 Enlarged lymph nodes, unspecified: Secondary | ICD-10-CM | POA: Diagnosis not present

## 2023-04-22 MED ORDER — HYDROCORTISONE (PERIANAL) 2.5 % EX CREA: 1.0000 | TOPICAL_CREAM | Freq: Two times a day (BID) | CUTANEOUS | 0 refills | Status: DC

## 2023-04-22 NOTE — Progress Notes (Signed)
Established Patient Office Visit  Subjective   Patient ID: Melvin Mcdonald, male    DOB: 2000/10/10  Age: 22 y.o. MRN: [id number]  Chief Complaint  Patient presents with   neck swelling    Patient states that he noticed about 1-2 months ago he had some swelling on the right side of his neck, He states that he believes it is his lymph node. Today patient states there still is some swelling but its not as bad as in the beginning. Patient also has a  complaint of blood on tissue after wiping with some pain. Patient states has been happening for the past 2 months,states that its not happening as frequently as it did in the beginning, last happened last week after bowel movement with some pain    HPI  Neck lump He reports 2 month hx of lump to his right neck. He thinks it's a lymph node. He reports he thinks he had another HSV outbreak and is unsure if it's suppose to still be swollen. Not painful. He has been recently diagnosed with HSV back in March 2024.  He does also report 2 month hx of blood on his toilet paper. He did also report pain with passing stool that lasted 2-3 weeks but has resolved. He says he also had a hard time passing stool during this time. No melena or hematochezia reported. He has noted lumps to the area at times during the time he saw blood on the tissue when wiping.   Review of Systems  Gastrointestinal:        Anal pain when defecating, blood when wiping  Skin:        Enlarged lymph node  All other systems reviewed and are negative.     Objective:     BP 107/66   Pulse 64   Temp 97.7 F (36.5 C) (Oral)   Resp 18   Ht 5\' 6"  (1.676 m)   Wt 180 lb 4.8 oz (81.8 kg)   SpO2 99%   BMI 29.10 kg/m  BP Readings from Last 3 Encounters:  04/22/23 107/66  11/13/22 113/66  11/09/22 135/79      Physical Exam Vitals and nursing note reviewed.  Constitutional:      Appearance: Normal appearance. He is normal weight.  HENT:     Head: Normocephalic and  atraumatic.     Right Ear: External ear normal.     Left Ear: External ear normal.     Nose: Nose normal.     Mouth/Throat:     Mouth: Mucous membranes are moist.     Pharynx: Oropharynx is clear.  Eyes:     Conjunctiva/sclera: Conjunctivae normal.     Pupils: Pupils are equal, round, and reactive to light.  Neck:     Comments: Enlarged lymph node Cardiovascular:     Rate and Rhythm: Normal rate.  Pulmonary:     Effort: Pulmonary effort is normal.  Lymphadenopathy:     Cervical: Cervical adenopathy present.  Skin:    General: Skin is warm.     Capillary Refill: Capillary refill takes less than 2 seconds.  Neurological:     General: No focal deficit present.     Mental Status: He is alert and oriented to person, place, and time. Mental status is at baseline.  Psychiatric:        Mood and Affect: Mood normal.        Behavior: Behavior normal.        Thought Content: Thought  content normal.        Judgment: Judgment normal.    No results found for any visits on 04/22/23.     The ASCVD Risk score (Arnett DK, et al., 2019) failed to calculate for the following reasons:   The 2019 ASCVD risk score is only valid for ages 76 to 24    Assessment & Plan:   Problem List Items Addressed This Visit   None Enlarged lymph node  Grade I hemorrhoids -     Hydrocortisone ([place]); Place 1 Application rectally 2 (two) times daily.  Dispense: 30 g; Refill: 0  HSV-1 (herpes simplex virus 1) infection   Have reassured the pt today that his enlarged lymph node was the same one from previous encounter where he was referred for ultrasound and was reactive inflammatory lymph node. He had his first HSV-1 outbreak at that time. Will monitor for now as he also reports he's had another outbreak in the interim.   His blood streaked tissue after bowel movements, lump in anal area, and pain when passing stool; are likely caused by hemorrhoids. Discussed this with pt and given him rx for  hydrocortisone cream to use bid prn for recurrence. Also to try sitz baths when they return. He also reports hard stool so blood could also be from anal tears. May resolve with addition of stool softeners.  No follow-ups on file.    Suzan Slick, MD

## 2023-08-12 ENCOUNTER — Ambulatory Visit: Payer: Self-pay | Admitting: Family Medicine

## 2023-08-20 ENCOUNTER — Ambulatory Visit: Payer: Self-pay | Admitting: Family Medicine

## 2023-08-20 VITALS — BP 131/67 | HR 73 | Temp 97.8°F | Resp 18 | Ht 66.0 in | Wt 176.8 lb

## 2023-08-20 DIAGNOSIS — B009 Herpesviral infection, unspecified: Secondary | ICD-10-CM

## 2023-08-20 DIAGNOSIS — R599 Enlarged lymph nodes, unspecified: Secondary | ICD-10-CM

## 2023-08-20 MED ORDER — VALACYCLOVIR HCL 500 MG PO TABS: 500.0000 mg | ORAL_TABLET | Freq: Every day | ORAL | 1 refills | Status: DC

## 2023-08-20 NOTE — Progress Notes (Signed)
Established Patient Office Visit  Subjective   Patient ID: Melvin Mcdonald, male    DOB: [birth year]  Age: 22 y.o. MRN: [id number]  Chief Complaint  Patient presents with   lymph node    HPI  Lymph node Pt is here for follow up of his lymph node that concerned him a few months ago. He was recently diagnosed with HSV this year in March. 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 He was started on Valtrex 500mg  daily. He also was sent for ultrasound to evaluate lymph node. This was presumed to be reactive. Reassured pt that this is likely from his infection. He also mentions a recent outbreak a month ago. Lasted a week. He was out of his medicine for this. Never asked to have this refilled.    Review of Systems  All other systems reviewed and are negative.    Objective:     There were no vitals taken for this visit. BP Readings from Last 3 Encounters:  08/20/23 131/67  04/22/23 107/66  11/13/22 113/66      Physical Exam Vitals and nursing note reviewed.  Constitutional:      Appearance: Normal appearance. He is normal weight.  HENT:     Head: Normocephalic and atraumatic.     Right Ear: External ear normal.     Left Ear: External ear normal.     Nose: Nose normal.     Mouth/Throat:     Mouth: Mucous membranes are moist.     Pharynx: Oropharynx is clear.  Eyes:     Conjunctiva/sclera: Conjunctivae normal.     Pupils: Pupils are equal, round, and reactive to light.  Neck:     Comments: Mildly enlarged right supraclavicular lymph node-similar in size to previous exam Cardiovascular:     Rate and Rhythm: Normal rate.  Pulmonary:     Effort: Pulmonary effort is normal.  Abdominal:     General: Bowel sounds are normal.  Skin:    General: Skin is warm.     Capillary Refill: Capillary refill takes less than 2 seconds.  Neurological:     General: No focal deficit present.     Mental Status: He is alert and oriented to person, place, and  time. Mental status is at baseline.  Psychiatric:        Mood and Affect: Mood normal.        Behavior: Behavior normal.        Thought Content: Thought content normal.        Judgment: Judgment normal.    No results found for any visits on 08/20/23.     The ASCVD Risk score (Arnett DK, et al., 2019) failed to calculate for the following reasons:   The 2019 ASCVD risk score is only valid for ages 79 to 59    Assessment & Plan:   Problem List Items Addressed This Visit   None HSV-1 (herpes simplex virus 1) infection -     valACYclovir HCl; Take 1 tablet (500 mg total) by mouth daily.  Dispense: 90 tablet; Refill: 1  Enlarged lymph node   To refill Valtrex 500mg  daily. Discussed how to take the medicine to pt again for suppressive vs dose during outbreak. He voiced understanding. To monitor lymph node for now. Opted to resend pt for ultrasound for surveillance. He states he's in between insurances right now and would like to defer this scan at this time.   No follow-ups on file.  Suzan Slick, MD

## 2023-08-20 NOTE — Patient Instructions (Signed)
Valtrex Take 1 tab po daily for suppressive therapy to prevent outbreaks If an outbreak occurs, you can start taking 2 tabs daily until outbreak resolves. Once resolved, go back to 1 tab po daily.

## 2023-10-26 ENCOUNTER — Encounter: Payer: Self-pay | Admitting: Family Medicine

## 2023-10-26 DIAGNOSIS — R599 Enlarged lymph nodes, unspecified: Secondary | ICD-10-CM

## 2023-10-28 ENCOUNTER — Ambulatory Visit (INDEPENDENT_AMBULATORY_CARE_PROVIDER_SITE_OTHER): Payer: Self-pay

## 2023-10-28 DIAGNOSIS — R599 Enlarged lymph nodes, unspecified: Secondary | ICD-10-CM

## 2023-11-09 ENCOUNTER — Encounter: Payer: Self-pay | Admitting: Family Medicine

## 2023-11-09 DIAGNOSIS — R599 Enlarged lymph nodes, unspecified: Secondary | ICD-10-CM

## 2023-11-09 DIAGNOSIS — R9389 Abnormal findings on diagnostic imaging of other specified body structures: Secondary | ICD-10-CM

## 2023-11-30 NOTE — Addendum Note (Signed)
 Addended by: Suzan Slick on: 11/30/2023 08:13 AM   Modules accepted: Orders

## 2023-12-21 ENCOUNTER — Ambulatory Visit

## 2023-12-21 DIAGNOSIS — R599 Enlarged lymph nodes, unspecified: Secondary | ICD-10-CM | POA: Diagnosis not present

## 2023-12-21 DIAGNOSIS — R9389 Abnormal findings on diagnostic imaging of other specified body structures: Secondary | ICD-10-CM

## 2023-12-21 MED ORDER — IOHEXOL 300 MG/ML  SOLN
100.0000 mL | Freq: Once | INTRAMUSCULAR | Status: AC | PRN
  Administered 2023-12-21: 75 mL via INTRAVENOUS

## 2024-01-13 ENCOUNTER — Ambulatory Visit: Payer: Self-pay | Admitting: Family Medicine

## 2024-07-15 ENCOUNTER — Encounter: Payer: Self-pay | Admitting: Family Medicine

## 2024-07-17 ENCOUNTER — Other Ambulatory Visit: Payer: Self-pay | Admitting: Family Medicine

## 2024-07-17 DIAGNOSIS — B009 Herpesviral infection, unspecified: Secondary | ICD-10-CM

## 2024-07-17 MED ORDER — VALACYCLOVIR HCL 500 MG PO TABS: 500.0000 mg | ORAL_TABLET | Freq: Every day | ORAL | 1 refills | Status: AC
